# Patient Record
Sex: Male | Born: 1980 | Race: White | Hispanic: No | Marital: Married | Smoking: Never smoker
Health system: Southern US, Community
[De-identification: ages and names within clinical notes are randomized; demographics above are authoritative.]

## PROBLEM LIST (undated history)

## (undated) DIAGNOSIS — Z22322 Carrier or suspected carrier of Methicillin resistant Staphylococcus aureus: Secondary | ICD-10-CM

## (undated) DIAGNOSIS — S7292XA Unspecified fracture of left femur, initial encounter for closed fracture: Secondary | ICD-10-CM

## (undated) HISTORY — DX: Unspecified fracture of left femur, initial encounter for closed fracture: S72.92XA

## (undated) HISTORY — PX: TONSILLECTOMY AND ADENOIDECTOMY: SUR1326

## (undated) HISTORY — PX: APPENDECTOMY: SHX54

---

## 2012-11-20 DIAGNOSIS — Z22322 Carrier or suspected carrier of Methicillin resistant Staphylococcus aureus: Secondary | ICD-10-CM

## 2012-11-20 HISTORY — DX: Carrier or suspected carrier of methicillin resistant Staphylococcus aureus: Z22.322

## 2013-07-25 ENCOUNTER — Encounter (HOSPITAL_COMMUNITY): Payer: Self-pay | Admitting: Emergency Medicine

## 2013-07-25 ENCOUNTER — Emergency Department (HOSPITAL_COMMUNITY)
Admission: EM | Admit: 2013-07-25 | Discharge: 2013-07-25 | Disposition: A | Discharge status: Home / Self Care | Payer: No Typology Code available for payment source | Attending: Emergency Medicine | Admitting: Emergency Medicine

## 2013-07-25 ENCOUNTER — Emergency Department (HOSPITAL_COMMUNITY): Payer: No Typology Code available for payment source

## 2013-07-25 DIAGNOSIS — L02419 Cutaneous abscess of limb, unspecified: Secondary | ICD-10-CM | POA: Insufficient documentation

## 2013-07-25 DIAGNOSIS — L03115 Cellulitis of right lower limb: Secondary | ICD-10-CM

## 2013-07-25 LAB — CBC WITH DIFFERENTIAL/PLATELET
Basophils Absolute: 0 10*3/uL (ref 0.0–0.1)
Basophils Relative: 0 % (ref 0–1)
Eosinophils Absolute: 0.2 10*3/uL (ref 0.0–0.7)
Eosinophils Relative: 2 % (ref 0–5)
HCT: 40.3 % (ref 39.0–52.0)
MCH: 30.8 pg (ref 26.0–34.0)
MCHC: 37.2 g/dL — ABNORMAL HIGH (ref 30.0–36.0)
MCV: 82.8 fL (ref 78.0–100.0)
Monocytes Absolute: 0.8 10*3/uL (ref 0.1–1.0)
Monocytes Relative: 8 % (ref 3–12)
Neutro Abs: 7 10*3/uL (ref 1.7–7.7)
RDW: 11.7 % (ref 11.5–15.5)

## 2013-07-25 LAB — BASIC METABOLIC PANEL
BUN: 23 mg/dL (ref 6–23)
Calcium: 9.3 mg/dL (ref 8.4–10.5)
Creatinine, Ser: 1.36 mg/dL — ABNORMAL HIGH (ref 0.50–1.35)
GFR calc Af Amer: 78 mL/min — ABNORMAL LOW (ref >90)

## 2013-07-25 MED ORDER — CLINDAMYCIN PHOSPHATE 900 MG/50ML IV SOLN: 900.0000 mg | Freq: Once | INTRAVENOUS | Status: DC

## 2013-07-25 MED ORDER — CLINDAMYCIN HCL 150 MG PO CAPS: 450.0000 mg | ORAL_CAPSULE | Freq: Three times a day (TID) | ORAL | Status: DC

## 2013-07-25 NOTE — ED Notes (Signed)
Patient transported to X-ray 

## 2013-07-25 NOTE — ED Provider Notes (Signed)
CSN: [id number]     Arrival date & time 07/25/13  0225 History   First MD Initiated Contact with Patient 07/25/13 [phone]     No chief complaint on file.  (Consider location/radiation/quality/duration/timing/severity/associated sxs/prior Treatment) HPI Comments: 32 year old male presents with a right anterior thigh abscess. Started off as small bumps that he considered to be a folliculitis and has progressed to larger swollen area with erythema over the course of a few days. 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 He tried to drain the abscess at home with a clean scalpel but got minimal fluid and mild blood w/o improvement. Has not had any systemic sx such as fevers or chills, but the redness around his leg has increased and so he felt he needed to be checked out. He also feels like he felt some crepitus on his right inner thigh which concerned him so he presented for an eval in the ED.  The history is provided by the patient.    No past medical history on file. No past surgical history on file. No family history on file. History  Substance Use Topics  . Smoking status: Not on file  . Smokeless tobacco: Not on file  . Alcohol Use: Not on file    Review of Systems  Constitutional: Negative for fever and chills.  Gastrointestinal: Negative for vomiting.  Musculoskeletal: Negative for joint swelling.  Skin: Positive for color change and wound.  Neurological: Negative for weakness.  All other systems reviewed and are negative.    Allergies  Review of patient's allergies indicates no known allergies.  Home Medications   Current Outpatient Rx  Name  Route  Sig  Dispense  Refill  . ibuprofen (ADVIL,MOTRIN) 200 MG tablet   Oral   Take 400 mg by mouth every 6 (six) hours as needed for pain.          BP 131/55  Pulse 57  Temp(Src) 98.9 F (37.2 C) (Oral)  Resp 16  SpO2 97% Physical Exam  Nursing note and vitals reviewed. Constitutional: He is  oriented to person, place, and time. He appears well-developed and well-nourished. No distress.  HENT:  Head: Normocephalic and atraumatic.  Right Ear: External ear normal.  Left Ear: External ear normal.  Nose: Nose normal.  Eyes: Right eye exhibits no discharge. Left eye exhibits no discharge.  Neck: Neck supple.  Cardiovascular: Intact distal pulses.   Pulmonary/Chest: Effort normal.  Abdominal: He exhibits no distension.  Musculoskeletal:       Legs: Neurological: He is alert and oriented to person, place, and time.  Skin: Skin is warm and dry. There is erythema.    ED Course  Procedures (including critical care time) Labs Review Labs Reviewed  CBC WITH DIFFERENTIAL - Abnormal; Notable for the following:    MCHC 37.2 (*)    All other components within normal limits  BASIC METABOLIC PANEL - Abnormal; Notable for the following:    Potassium 3.2 (*)    Glucose, Bld 109 (*)    Creatinine, Ser 1.36 (*)    GFR calc non Af Amer 68 (*)    GFR calc Af Amer 78 (*)    All other components within normal limits   Imaging Review Dg Femur Right  07/25/2013   *RADIOLOGY REPORT*  Clinical Data: Right anterior thigh abscess, question crepitus.  RIGHT FEMUR - 2 VIEW  Comparison: None.  Findings: No subcutaneous emphysema identified in the thigh.  There is soft tissue swelling  of the anterior mid and lower thigh, without radiodense foreign body.  Negative for osseous infection or fracture.  IMPRESSION: No subcutaneous emphysema.  Negative for osseous infection.   Original Report Authenticated By: Tiburcio Pea    MDM   1. Cellulitis of right thigh    Patient is healthy w/o any complicating medical hx such as DM or other immunocompromise. I do not feel any tenderness to medial thigh or crepitus. Xray shows no gas. Bedside u/s shows no pockets of fluid and is c/w cellulitis. Given his streaking I offered IV abx but patient declines and states he'd prefer oral abx and to treat symptomatically  at home. 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 Will d/c with clinda, he will get his first dose at the 24 hour pharmacy as soon as he leaves the ED.     Audree Camel, MD 07/25/13 [phone]

## 2013-07-29 ENCOUNTER — Ambulatory Visit (INDEPENDENT_AMBULATORY_CARE_PROVIDER_SITE_OTHER): Payer: No Typology Code available for payment source | Admitting: Internal Medicine

## 2013-07-29 ENCOUNTER — Telehealth: Payer: Self-pay | Admitting: Internal Medicine

## 2013-07-29 ENCOUNTER — Encounter: Payer: Self-pay | Admitting: Internal Medicine

## 2013-07-29 VITALS — BP 110/80 | Temp 98.4°F | Wt 186.0 lb

## 2013-07-29 DIAGNOSIS — L02419 Cutaneous abscess of limb, unspecified: Secondary | ICD-10-CM

## 2013-07-29 DIAGNOSIS — L0291 Cutaneous abscess, unspecified: Secondary | ICD-10-CM

## 2013-07-29 MED ORDER — CLINDAMYCIN HCL 150 MG PO CAPS: 450.0000 mg | ORAL_CAPSULE | Freq: Three times a day (TID) | ORAL | Status: DC

## 2013-07-29 MED ORDER — SULFAMETHOXAZOLE-TMP DS 800-160 MG PO TABS: 1.0000 | ORAL_TABLET | Freq: Two times a day (BID) | ORAL | Status: AC

## 2013-07-29 NOTE — Telephone Encounter (Signed)
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 Has been self treating his cellulitis w 2 rounds of antibiotics w/ no results. pt going out of town tomorrow to [place]. Would like acute visit today and then schedule and new pt appt .pls advise if you will accept appt this way.

## 2013-07-29 NOTE — Progress Notes (Signed)
Subjective:    Patient ID: Tyler Harrison, male    DOB: [birth year], 32 y.o.   MRN: [id number]  HPI  32 year old white male to establish. Patient complains of dealing with a right thigh abscess since September 1. He first noticed small bump middle of right thigh. His symptoms progressively got worse. Small incision and drainage was performed around September 3rd.  He kept area clan.   Area of redness spread between 9/4 - 9/5. He was started on clindamycin. There was question of whether he felt mild crepitus underneath the skin of his inner thigh. Crepitus promptly resolved. He denies any fever or chills. He finished a three-day course of clindamycin. He tolerated well without any side effects. After consulting with another physician, his antibiotic regimen was changed to Bactrim DS twice a day and Keflex 500 mg 4 times a day. He is on day 3. He reports cellulitis is getting better. He still has indurated area around the abscess. Center is abscess is dark in color.  No culture performed.   Review of Systems  Constitutional: Negative for activity change, appetite change and unexpected weight change.  Eyes: Negative for visual disturbance.  Respiratory: Negative for cough, chest tightness and shortness of breath.   Cardiovascular: Negative for chest pain.  Genitourinary: Negative for difficulty urinating.  Neurological: Negative for headaches.  Gastrointestinal: Negative for abdominal pain, heartburn melena or hematochezia. Negative for diarrhea     Past Medical History  Diagnosis Date  . Femur fracture, left     History of Hairline Fracture    History   Social History  . Marital Status: Married    Spouse Name: N/A    Number of Children: N/A  . Years of Education: N/A   Occupational History  . ER Physician [employer]   Social History Main Topics  . Smoking status: Never Smoker   . Smokeless tobacco: Not on file  . Alcohol Use: Yes  . Drug Use: No  . Sexual Activity: Not on file    Other Topics Concern  . Not on file   Social History Narrative   Works as emergency room physician   Moved from [place]   Married with 3 children (ages 4-1/2, 3, 1 1/2)    Past Surgical History  Procedure Laterality Date  . Appendectomy    . Tonsillectomy and adenoidectomy      Family History  Problem Relation Age of Onset  . Alcohol abuse Father   . Alcohol abuse Brother   . Heart attack Paternal Grandfather     No Known Allergies  Current Outpatient Prescriptions on File Prior to Visit  Medication Sig Dispense Refill  . ibuprofen (ADVIL,MOTRIN) 200 MG tablet Take 400 mg by mouth every 6 (six) hours as needed for pain.       No current facility-administered medications on file prior to visit.    BP 110/80  Temp(Src) 98.4 F (36.9 C) (Oral)  Wt 186 lb (84.369 kg)    Objective:   Physical Exam  Constitutional: He is oriented to person, place, and time. He appears well-developed and well-nourished. No distress.  HENT:  Head: Normocephalic and atraumatic.  Right Ear: External ear normal.  Left Ear: External ear normal.  Mouth/Throat: Oropharynx is clear and moist.  Eyes: EOM are normal. Pupils are equal, round, and reactive to light.  Neck: Neck supple.  No carotid bruit  Cardiovascular: Normal rate, regular rhythm and normal heart sounds.   No murmur heard. Pulmonary/Chest: Effort normal and breath sounds  normal. He has no wheezes.  Abdominal: Bowel sounds are normal. He exhibits no distension and no mass.  Musculoskeletal: He exhibits no edema.  Lymphadenopathy:    He has no cervical adenopathy.  Neurological: He is alert and oriented to person, place, and time. No cranial nerve deficit.  Skin:  To use a 3 cm abscess with surrounding induration. Able to express small amount of pus.   Area is tender with deep palpation.  Mild surrounding erythema that approaches groin area. Surrounding skin is nontender.  Slight odor to wound  Psychiatric: He has a normal  mood and affect. His behavior is normal.          Assessment & Plan:

## 2013-07-29 NOTE — Telephone Encounter (Signed)
Ok per Dr Artist Pais to see as acute and establish later

## 2013-07-29 NOTE — Patient Instructions (Addendum)
Apply warm compress twice daily I suspect you will need incision and drainage once abscess organizes Our office will contact you re: culture results Please contact our office if your symptoms do not improve or gets worse.

## 2013-07-29 NOTE — Telephone Encounter (Signed)
Pt aware and will make appt after visit. Due to schedule

## 2013-07-29 NOTE — Assessment & Plan Note (Addendum)
32 year old white male has right mid thigh abscess with surrounding cellulitis. Since starting antibiotics, his edness slightly improved. Abscess draining small amount of fluid. Culture sent. I suggest we discontinue Keflex and use double coverage for possible MRSA. Continue Bactrim DS and add clindamycin. I suspect abscess will organize and will need to be drained within 3-5 days. Patient to apply warm compress and keep area covered. Patient understands to seek urgent medical care should he develop fever or worsening cellulitis. We discussed surgical referral should he develop complex abscess.  Addendum: 07/30/13 10:18 AM - patient reports draining small pocket of pus.  He is concerned about possible resistance to clindamycin and would like to switch to doxycycline.  Continue same dose of bactrim.  See med list.

## 2013-07-30 MED ORDER — DOXYCYCLINE HYCLATE 100 MG PO TABS: 100.0000 mg | ORAL_TABLET | Freq: Two times a day (BID) | ORAL | Status: DC

## 2013-07-30 NOTE — Telephone Encounter (Addendum)
Pt would like to try to doxycycline instead on clindamycin. Pt forgot he tried clindamycin

## 2013-07-30 NOTE — Addendum Note (Signed)
Addended by: Meda Coffee on: 07/30/2013 10:21 AM   Modules accepted: Orders, Medications

## 2013-07-30 NOTE — Telephone Encounter (Signed)
Per Dr Artist Pais he sent the doxycycline in electronically to pharmacy and he wanted pt to be aware that it causes photosensitivity.  Pt aware and will pick up rx.

## 2013-07-30 NOTE — Telephone Encounter (Signed)
Pt pharm is [facility] on [address] [phone]

## 2013-08-01 LAB — WOUND CULTURE

## 2013-08-30 ENCOUNTER — Encounter (HOSPITAL_COMMUNITY): Payer: Self-pay | Admitting: Emergency Medicine

## 2013-08-30 ENCOUNTER — Emergency Department (HOSPITAL_COMMUNITY)
Admission: EM | Admit: 2013-08-30 | Discharge: 2013-08-30 | Disposition: A | Discharge status: Home / Self Care | Payer: No Typology Code available for payment source | Attending: Emergency Medicine | Admitting: Emergency Medicine

## 2013-08-30 ENCOUNTER — Encounter (HOSPITAL_COMMUNITY)
Admission: EM | Disposition: A | Discharge status: Home / Self Care | Payer: Self-pay | Source: Home / Self Care | Attending: Emergency Medicine

## 2013-08-30 ENCOUNTER — Emergency Department (HOSPITAL_COMMUNITY): Payer: No Typology Code available for payment source | Admitting: Certified Registered Nurse Anesthetist

## 2013-08-30 ENCOUNTER — Encounter (HOSPITAL_COMMUNITY): Payer: No Typology Code available for payment source | Admitting: Certified Registered Nurse Anesthetist

## 2013-08-30 DIAGNOSIS — L02419 Cutaneous abscess of limb, unspecified: Secondary | ICD-10-CM | POA: Insufficient documentation

## 2013-08-30 DIAGNOSIS — Z8781 Personal history of (healed) traumatic fracture: Secondary | ICD-10-CM | POA: Insufficient documentation

## 2013-08-30 DIAGNOSIS — Z139 Encounter for screening, unspecified: Secondary | ICD-10-CM

## 2013-08-30 DIAGNOSIS — Z9889 Other specified postprocedural states: Secondary | ICD-10-CM | POA: Insufficient documentation

## 2013-08-30 DIAGNOSIS — Z8614 Personal history of Methicillin resistant Staphylococcus aureus infection: Secondary | ICD-10-CM | POA: Insufficient documentation

## 2013-08-30 HISTORY — DX: Carrier or suspected carrier of methicillin resistant Staphylococcus aureus: Z22.322

## 2013-08-30 HISTORY — PX: INCISION AND DRAINAGE: SHX5863

## 2013-08-30 LAB — CBC
Hemoglobin: 15.3 g/dL (ref 13.0–17.0)
MCH: 30.2 pg (ref 26.0–34.0)
MCV: 84.2 fL (ref 78.0–100.0)
RBC: 5.06 MIL/uL (ref 4.22–5.81)
WBC: 8.9 10*3/uL (ref 4.0–10.5)

## 2013-08-30 SURGERY — INCISION AND DRAINAGE
Anesthesia: General | Site: Knee | Laterality: Right | Wound class: Dirty or Infected

## 2013-08-30 MED ORDER — PROMETHAZINE HCL 25 MG/ML IJ SOLN: 6.2500 mg | INTRAMUSCULAR | Status: DC | PRN

## 2013-08-30 MED ORDER — VANCOMYCIN HCL IN DEXTROSE 1-5 GM/200ML-% IV SOLN
1000.0000 mg | Freq: Once | INTRAVENOUS | Status: AC
  Administered 2013-08-30: 1000 mg via INTRAVENOUS
  Filled 2013-08-30: qty 200

## 2013-08-30 MED ORDER — ONDANSETRON HCL 4 MG/2ML IJ SOLN
INTRAMUSCULAR | Status: DC | PRN
  Administered 2013-08-30: 4 mg via INTRAMUSCULAR

## 2013-08-30 MED ORDER — SODIUM CHLORIDE 0.9 % IV SOLN: Freq: Once | INTRAVENOUS | Status: DC

## 2013-08-30 MED ORDER — FENTANYL CITRATE 0.05 MG/ML IJ SOLN: 25.0000 ug | INTRAMUSCULAR | Status: DC | PRN

## 2013-08-30 MED ORDER — SODIUM CHLORIDE 0.9 % IR SOLN
Status: DC | PRN
  Administered 2013-08-30: 15:00:00

## 2013-08-30 MED ORDER — TRAMADOL HCL 50 MG PO TABS: 50.0000 mg | ORAL_TABLET | Freq: Four times a day (QID) | ORAL | Status: AC | PRN

## 2013-08-30 MED ORDER — SODIUM CHLORIDE 0.9 % IV BOLUS (SEPSIS)
1000.0000 mL | Freq: Once | INTRAVENOUS | Status: AC
  Administered 2013-08-30: 1000 mL via INTRAVENOUS

## 2013-08-30 MED ORDER — FENTANYL CITRATE 0.05 MG/ML IJ SOLN
INTRAMUSCULAR | Status: DC | PRN
  Administered 2013-08-30 (×2): 50 ug via INTRAVENOUS

## 2013-08-30 MED ORDER — LIDOCAINE HCL (CARDIAC) 20 MG/ML IV SOLN
INTRAVENOUS | Status: DC | PRN
  Administered 2013-08-30: 100 mg via INTRAVENOUS

## 2013-08-30 MED ORDER — LACTATED RINGERS IV SOLN
INTRAVENOUS | Status: DC | PRN
  Administered 2013-08-30: 14:00:00 via INTRAVENOUS

## 2013-08-30 MED ORDER — PROPOFOL 10 MG/ML IV BOLUS
INTRAVENOUS | Status: DC | PRN
  Administered 2013-08-30: 100 mg via INTRAVENOUS
  Administered 2013-08-30: 200 mg via INTRAVENOUS

## 2013-08-30 MED ORDER — 0.9 % SODIUM CHLORIDE (POUR BTL) OPTIME
TOPICAL | Status: DC | PRN
  Administered 2013-08-30: 1000 mL

## 2013-08-30 MED ORDER — HYDROMORPHONE HCL PF 1 MG/ML IJ SOLN: 0.2500 mg | INTRAMUSCULAR | Status: DC | PRN

## 2013-08-30 MED ORDER — MIDAZOLAM HCL 5 MG/5ML IJ SOLN
INTRAMUSCULAR | Status: DC | PRN
  Administered 2013-08-30: 2 mg via INTRAVENOUS

## 2013-08-30 SURGICAL SUPPLY — 12 items
BANDAGE ELASTIC 4 VELCRO ST LF (GAUZE/BANDAGES/DRESSINGS) ×2 IMPLANT
DRSG AQUACEL AG ADV 3.5X 6 (GAUZE/BANDAGES/DRESSINGS) ×2 IMPLANT
DRSG TEGADERM 4X4.75 (GAUZE/BANDAGES/DRESSINGS) ×2 IMPLANT
GAUZE XEROFORM 5X9 LF (GAUZE/BANDAGES/DRESSINGS) ×2 IMPLANT
KIT BASIN OR (CUSTOM PROCEDURE TRAY) ×2 IMPLANT
PACK LOWER EXTREMITY WL (CUSTOM PROCEDURE TRAY) ×2 IMPLANT
SPONGE GAUZE 4X4 12PLY (GAUZE/BANDAGES/DRESSINGS) ×2 IMPLANT
STOCKINETTE 8 INCH (MISCELLANEOUS) ×2 IMPLANT
SUCTION FRAZIER TIP 10 FR DISP (SUCTIONS) ×2 IMPLANT
SUT ETHILON 2 0 PS N (SUTURE) ×2 IMPLANT
TOWEL OR 17X26 10 PK STRL BLUE (TOWEL DISPOSABLE) ×2 IMPLANT
TOWEL OR NON WOVEN STRL DISP B (DISPOSABLE) ×2 IMPLANT

## 2013-08-30 NOTE — Anesthesia Preprocedure Evaluation (Signed)
Anesthesia Evaluation  Patient identified by MRN, date of birth, ID band Patient awake    Reviewed: Allergy & Precautions, H&P , NPO status , Patient's Chart, lab work & pertinent test results  Airway Mallampati: II TM Distance: >3 FB Neck ROM: Full    Dental no notable dental hx.    Pulmonary neg pulmonary ROS,  breath sounds clear to auscultation  Pulmonary exam normal       Cardiovascular Exercise Tolerance: Good negative cardio ROS  Rhythm:Regular Rate:Normal     Neuro/Psych negative neurological ROS  negative psych ROS   GI/Hepatic negative GI ROS, Neg liver ROS,   Endo/Other  negative endocrine ROS  Renal/GU negative Renal ROS  negative genitourinary   Musculoskeletal negative musculoskeletal ROS (+)   Abdominal   Peds negative pediatric ROS (+)  Hematology negative hematology ROS (+)   Anesthesia Other Findings   Reproductive/Obstetrics negative OB ROS                           Anesthesia Physical Anesthesia Plan  ASA: I and emergent  Anesthesia Plan: General   Post-op Pain Management:    Induction: Intravenous  Airway Management Planned: LMA  Additional Equipment:   Intra-op Plan:   Post-operative Plan: Extubation in OR  Informed Consent: I have reviewed the patients History and Physical, chart, labs and discussed the procedure including the risks, benefits and alternatives for the proposed anesthesia with the patient or authorized representative who has indicated his/her understanding and acceptance.   Dental advisory given  Plan Discussed with: CRNA  Anesthesia Plan Comments:         Anesthesia Quick Evaluation

## 2013-08-30 NOTE — Anesthesia Postprocedure Evaluation (Signed)
  Anesthesia Post-op Note  Patient: Tyler Harrison  Procedure(s) Performed: Procedure(s) (LRB): INCISION AND DRAINAGE right knee (Right)  Patient Location: PACU  Anesthesia Type: General  Level of Consciousness: awake and alert   Airway and Oxygen Therapy: Patient Spontanous Breathing  Post-op Pain: mild  Post-op Assessment: Post-op Vital signs reviewed, Patient's Cardiovascular Status Stable, Respiratory Function Stable, Patent Airway and No signs of Nausea or vomiting  Last Vitals:  Filed Vitals:   08/30/13 1500  BP: 124/64  Pulse: 50  Temp:   Resp: 14    Post-op Vital Signs: stable   Complications: No apparent anesthesia complications

## 2013-08-30 NOTE — ED Notes (Signed)
CHG done for OR

## 2013-08-30 NOTE — H&P (Signed)
  Tyler Harrison is an 32 y.o. male.    Chief Complaint:  RIGHT knee superficial abscess    HPI: Pt is a 32 y.o. male with 2-3 weeks history of superficial abscess cultured to be MRSA.  The first was on his anterior thigh, cleared with local treatment, then this second one appeared anterior lateral to his knee.  He was seen and evaluated in the office yesterday but given the persistence despite treatment and concerns for penetration in the joint we felt it was safest to address this formally in the OR.   PCP:  Thomos Lemons, DO  D/C Plans: To be determined following appropriate treatment plan  PMH: Past Medical History  Diagnosis Date  . Femur fracture, left     History of Hairline Fracture  . MRSA (methicillin resistant staph aureus) culture positive 2014    PSH: Past Surgical History  Procedure Laterality Date  . Appendectomy    . Tonsillectomy and adenoidectomy      Social History:  reports that he has never smoked. He does not have any smokeless tobacco history on file. He reports that he drinks alcohol. He reports that he does not use illicit drugs.  Allergies:  No Known Allergies  Medications:  (Not in a hospital admission)  No results found for this or any previous visit (from the past 48 hour(s)). No results found.  ROS: Review of Systems - General ROS: negative for - chills, fever or night sweats Allergy and Immunology ROS: negative for - hives, latex or seasonal allergies Respiratory ROS: no cough, shortness of breath, or wheezing Cardiovascular ROS: no chest pain or dyspnea on exertion Gastrointestinal ROS: no abdominal pain, change in bowel habits, or black or bloody stools Musculoskeletal ROS: negative for - gait disturbance, joint pain or but positive for tightness with knee flexion and pitting edema right leg Dermatological ROS: positive for skin lesion changes  Physican Exam: Blood pressure 137/77, pulse 53, temperature 98.7 F (37.1 C), temperature source  Oral, resp. rate 18, SpO2 99.00%.  Exam: Awake alert healthy male  Right knee small effusion 4-5 cm diameter erythematous area antero-lateral right knee No pain with flexion only tightness at full flexion  NVI Healed lesion anterior thigh  Chest clear Heart normal Abdomen non-tender  Assessment/Plan Assessment:  Right knee superficial abscess felt to be MRSA positive   Plan: Patient to go to OR today for an excision debridement of his right knee abscess. Patient understand risks, benefits and expectation and wishes to proceed.  Will discharge to home with continued PO antibiotics    Madlyn Frankel. Charlann Boxer, MD  08/30/2013, 1:53 PM

## 2013-08-30 NOTE — Transfer of Care (Signed)
Immediate Anesthesia Transfer of Care Note  Patient: Tyler Harrison  Procedure(s) Performed: Procedure(s): INCISION AND DRAINAGE right knee (Right)  Patient Location: PACU  Anesthesia Type:General  Level of Consciousness: awake, alert , oriented and patient cooperative  Airway & Oxygen Therapy: Patient Spontanous Breathing and Patient connected to face mask oxygen  Post-op Assessment: Report given to PACU RN and Post -op Vital signs reviewed and stable  Post vital signs: Reviewed and stable  Complications: No apparent anesthesia complications

## 2013-08-30 NOTE — Brief Op Note (Signed)
08/30/2013  2:54 PM  PATIENT:  Tyler Harrison  32 y.o. male  PRE-OPERATIVE DIAGNOSIS:  Right knee superficial abscess  POST-OPERATIVE DIAGNOSIS:  Right knee superficial abscess  PROCEDURE:  Procedure(s): Excisional and non-excisional debridement right knee abscess  SURGEON:  Surgeon(s) and Role:    * Shelda Pal, MD - Primary  PHYSICIAN ASSISTANT: None  ASSISTANTS: Surgical center   ANESTHESIA:   general  EBL:  Total I/O In: 200 [I.V.:200] Out: -   BLOOD ADMINISTERED:none  DRAINS: none   LOCAL MEDICATIONS USED:  NONE  SPECIMEN:  No Specimen  DISPOSITION OF SPECIMEN:  N/A  COUNTS:  YES  TOURNIQUET:  * No tourniquets in log *  DICTATION: .Other Dictation: Dictation Number [id number]  PLAN OF CARE: Discharge to home after PACU  PATIENT DISPOSITION:  PACU - hemodynamically stable.   Delay start of Pharmacological VTE agent (>24hrs) due to surgical blood loss or risk of bleeding: not applicable

## 2013-08-30 NOTE — Preoperative (Signed)
Beta Blockers   Reason not to administer Beta Blockers:Not Applicable 

## 2013-08-30 NOTE — ED Provider Notes (Signed)
MSE was initiated and I personally evaluated the patient and placed orders (if any) at  12:52 PM on August 30, 2013.  The patient appears stable so that the remainder of the MSE may be completed by another provider, Dr. Charlann Boxer.  Pt is here at recommendations by his orthopedist for outpt surgery for I&D of right knee infection.  Pt is ambulatory, no respiratory symptoms, no SOB.  Labs, IV ordered by Dr. Charlann Boxer and expectation is pt will go to the OR shortly.    Gavin Pound. Babe Clenney, MD 08/30/13 1253

## 2013-08-30 NOTE — ED Notes (Addendum)
Pt presents to ED with pre-arranged plan to go to OR for an I&D of the right knee by Dr. Charlann Boxer. Pt states that the skin area to the right knee was warm to the touch and has redness over the knee cap. Pt has a history of a MRSA + culture a few weeks prior to an area that is proximal and on the anterior mid-thigh. Pt reports being on bacterium and doxycyline. Pt is A/O x4 and in NAD.

## 2013-08-31 NOTE — Op Note (Signed)
Tyler Harrison, Tyler Harrison               ACCOUNT NO.:  [id number]  MEDICAL RECORD NO.:  [id number]  LOCATION:  [facility]                         FACILITY:  [facility]  PHYSICIAN:  Madlyn Frankel. Charlann Boxer, M.D.  DATE OF BIRTH:  Nov 02, 1981  DATE OF PROCEDURE:  08/30/2013 DATE OF DISCHARGE:  08/30/2013                              OPERATIVE REPORT   PREOPERATIVE DIAGNOSIS:  Right knee superficial abscess.  POSTOPERATIVE DIAGNOSIS:  Right knee superficial abscess.  PROCEDURE:  Excisional and nonexcisional debridement of right knee superficial abscess irrigated with 1 L of bacitracin laden fluid. Sharp excision of about 4-5 cm with a scalpel, debriding skin and subcutaneous tissue.  SURGEON:  Madlyn Frankel. Charlann Boxer, M.D.  ASSISTANT:  Surgical team.  ANESTHESIA:  General LMA.  SPECIMENS:  None taken.  DRAINS:  None.  COMPLICATIONS:  None.  TOURNIQUET:  None.  BLOOD LOSS:  Minimal.  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  He had a history of anterior thigh lesion that was diagnosed with a bacterial swab, has been positive for MRSA.  Given the proximity of the knee, there was concern of potential penetration in the knee joint leading to infection.  He was seen and evaluated in the office, I had aspirated his knee and only obtained minimal amount of fluid, but it was clear synovial fluid.  No signs of infection.  Given these findings, I felt comfortable that there was no evidence of any septic arthritis that correlated with his exam, however, given the proximity of his knee joint, the concern for MRSA, as well as due to his employment, we felt that the best thing to do would be to go ahead and definitively treat this in the formal environment while continuing antibiotics.  The risks, benefits, and recurrence versus potential penetration of the joint were identified.   Consent was obtained for benefit of management of this infection.  PROCEDURE IN DETAIL:  The patient was brought to the operative theater. Once adequate anesthesia was administered, the patient was positioned supine.  I had given him 1 g of vancomycin in the holding room.  His right leg was then prepped and draped in sterile fashion.  Time-out was performed identifying the patient, planned procedure, and extremity.  At this point, I made a small 3-4 cm incision over the area of his superficial abscess draining obvious purulence.  I sharply debrided and excised using a scalpel the skin and subcutaneous tissue that was affected by this area.  I then debrided the underlying soft tissues. Following the debridement and obtaining hemostasis, I irrigated this wound out with 1 L of bacitracin laden fluid.  Following further evaluation, I reapproximated the skin edges using 4-0 interrupted nylon sutures allowing for primary closure of the wound, but yet allowing for some drainage if some were to happen.  His knee was then dressed with Xeroform, gauze, Tegaderm, and an Ace wrap.  The patient was brought to the recovery room in stable condition tolerating the procedure well.  He will return home this evening. Continue antibiotics.  I will have him come back  to see me in about 10- 12 days for wound check.     Madlyn Frankel Charlann Boxer, M.D.     MDO/MEDQ  D:  08/30/2013  T:  08/31/2013  Job:  [id number]

## 2013-09-01 ENCOUNTER — Encounter (HOSPITAL_COMMUNITY): Payer: Self-pay | Admitting: Orthopedic Surgery

## 2014-08-14 ENCOUNTER — Ambulatory Visit (HOSPITAL_COMMUNITY)
Admission: RE | Admit: 2014-08-14 | Discharge: 2014-08-14 | Disposition: A | Discharge status: Home / Self Care | Payer: No Typology Code available for payment source | Attending: Emergency Medicine | Admitting: Emergency Medicine

## 2014-08-14 ENCOUNTER — Other Ambulatory Visit (HOSPITAL_COMMUNITY): Payer: Self-pay | Admitting: Emergency Medicine

## 2014-08-14 ENCOUNTER — Ambulatory Visit (HOSPITAL_COMMUNITY)
Admission: RE | Admit: 2014-08-14 | Discharge: 2014-08-14 | Disposition: A | Discharge status: Home / Self Care | Payer: PRIVATE HEALTH INSURANCE | Source: Ambulatory Visit | Attending: Emergency Medicine | Admitting: Emergency Medicine

## 2014-08-14 DIAGNOSIS — R52 Pain, unspecified: Secondary | ICD-10-CM

## 2014-08-14 DIAGNOSIS — M79609 Pain in unspecified limb: Secondary | ICD-10-CM | POA: Diagnosis not present

## 2014-09-03 IMAGING — CR DG FEMUR 2+V*R*
4 series · 4 of 4 positions shown · non-contrast
Comparison: None.

CLINICAL DATA: Right anterior thigh abscess, question crepitus.

RIGHT FEMUR - 2 VIEW

[t femur with hip  ap right]
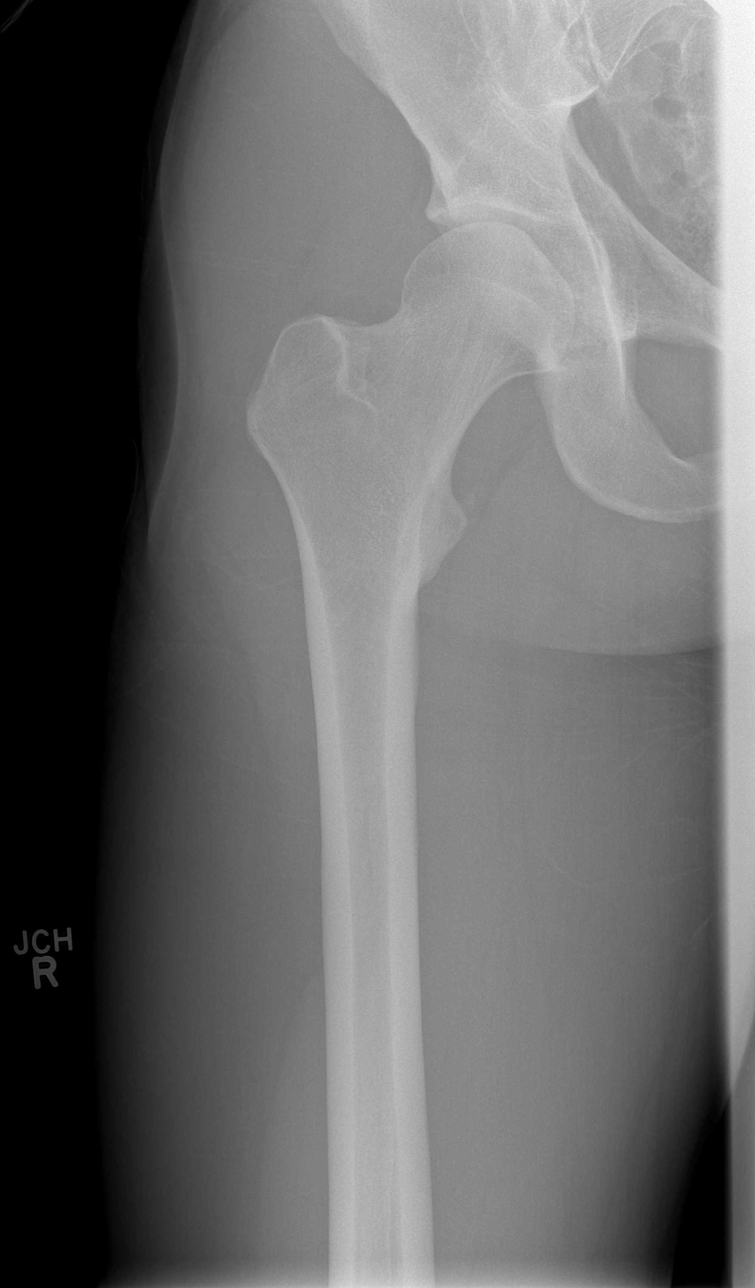

[t femur with knee ap right]
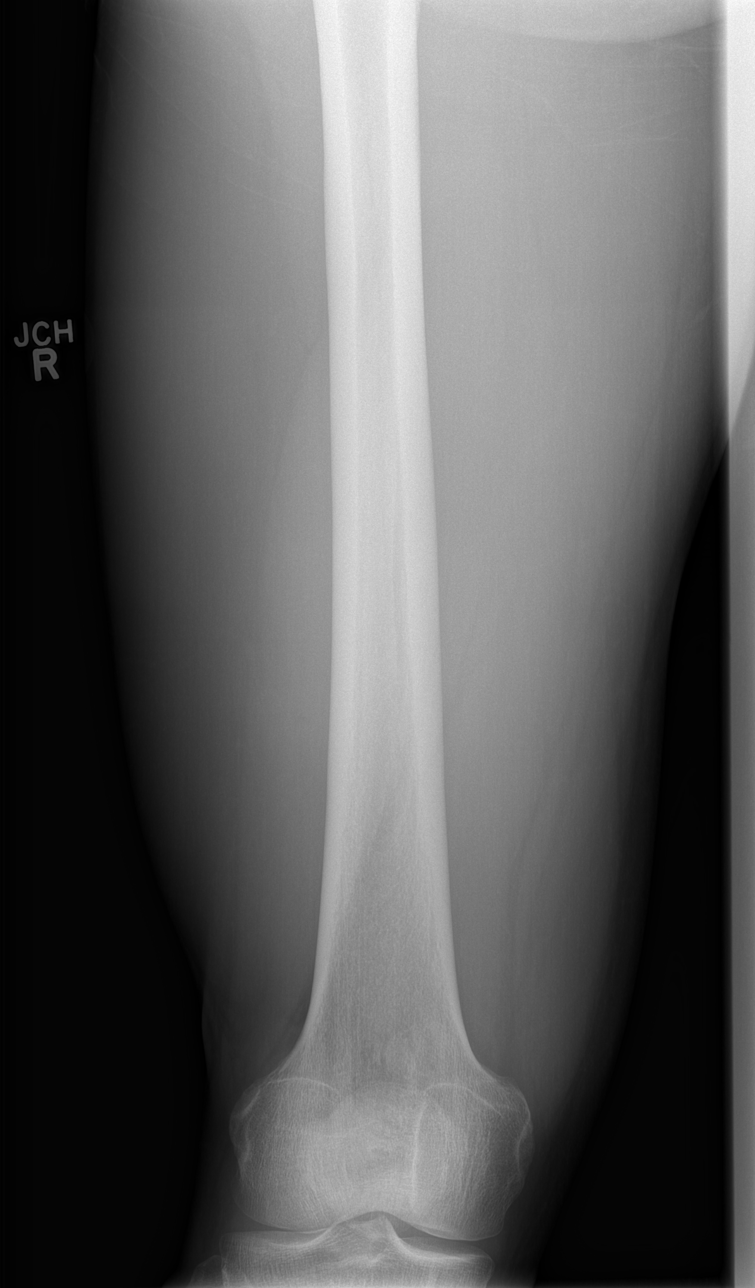

[t femur with hip lat right]
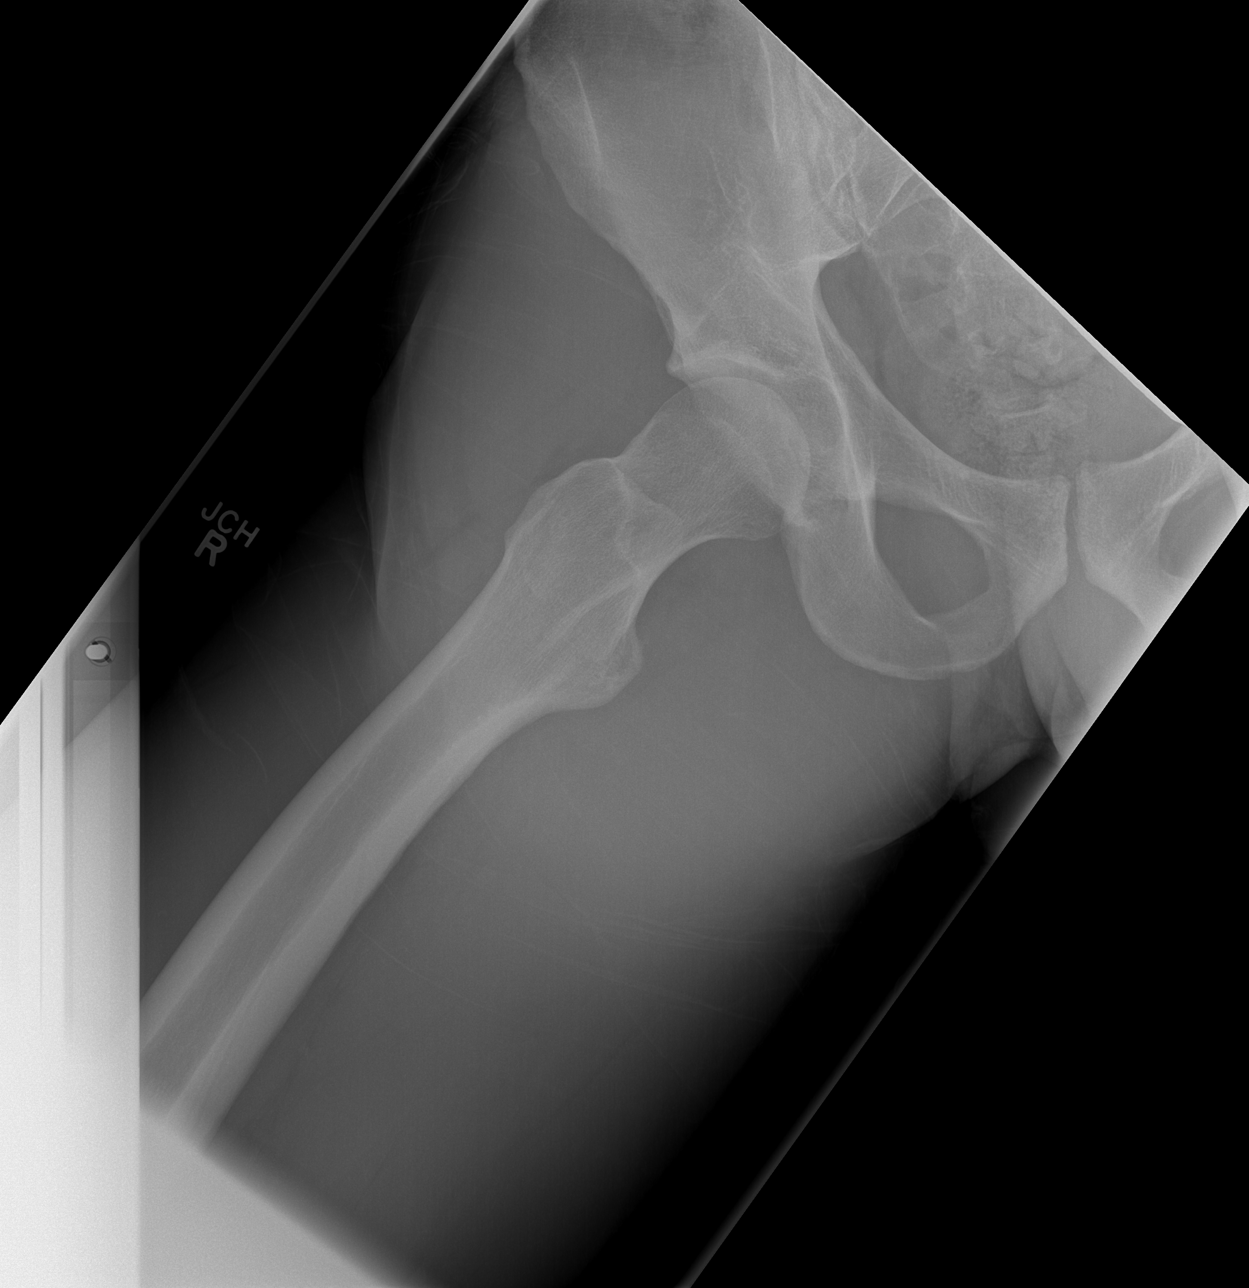

[t femur with knee lat right]
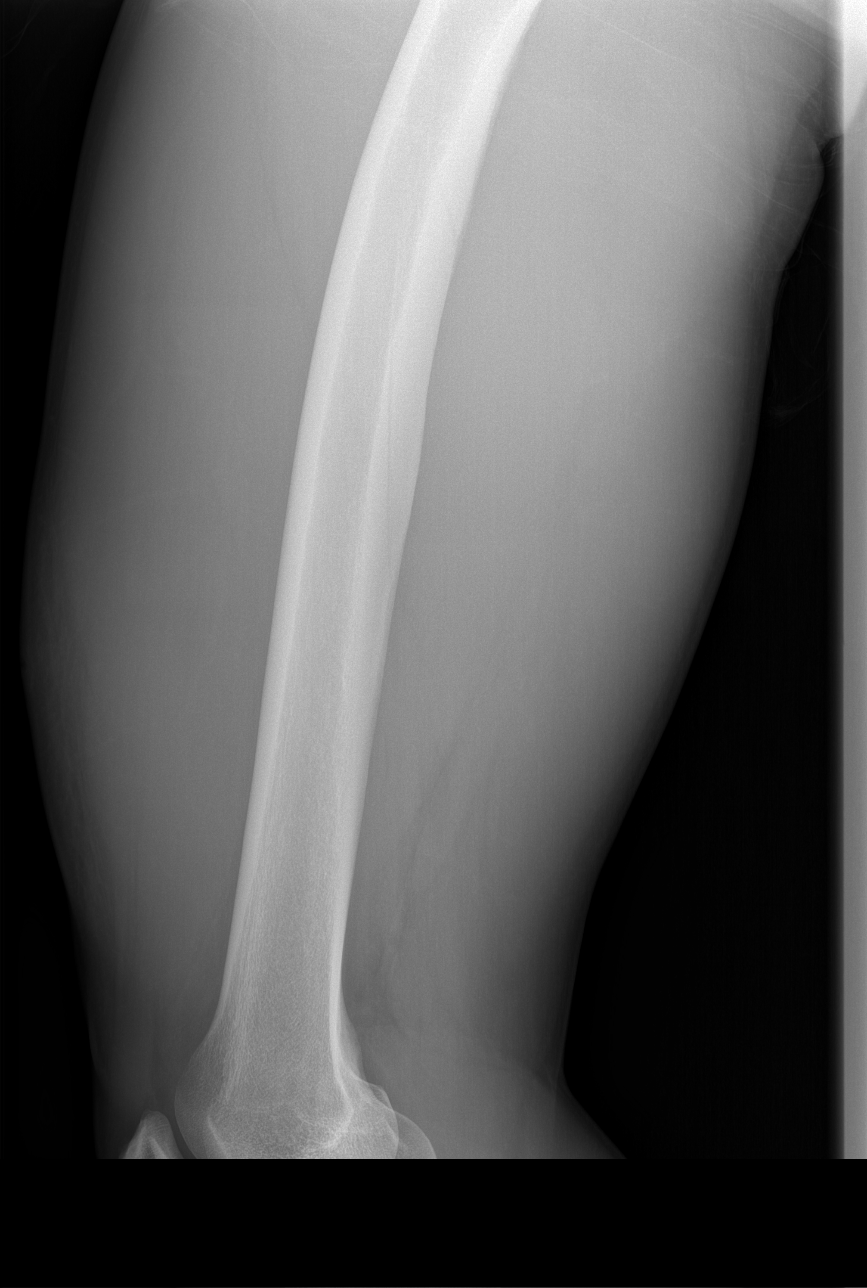

[4 of 4 positions shown; findings below may reference images not displayed]

FINDINGS: No subcutaneous emphysema identified in the thigh.  There
is soft tissue swelling of the anterior mid and lower thigh,
without radiodense foreign body.  Negative for osseous infection or
fracture.
IMPRESSION: No subcutaneous emphysema.  Negative for osseous infection.

## 2015-09-23 IMAGING — CR DG FOOT COMPLETE 3+V*L*
3 series · 3 of 3 positions shown · non-contrast
Comparison: None.

CLINICAL DATA: First metatarsal pain, injury playing [employer]

EXAM:
LEFT FOOT - COMPLETE 3+ VIEW

[x ankle ap left]
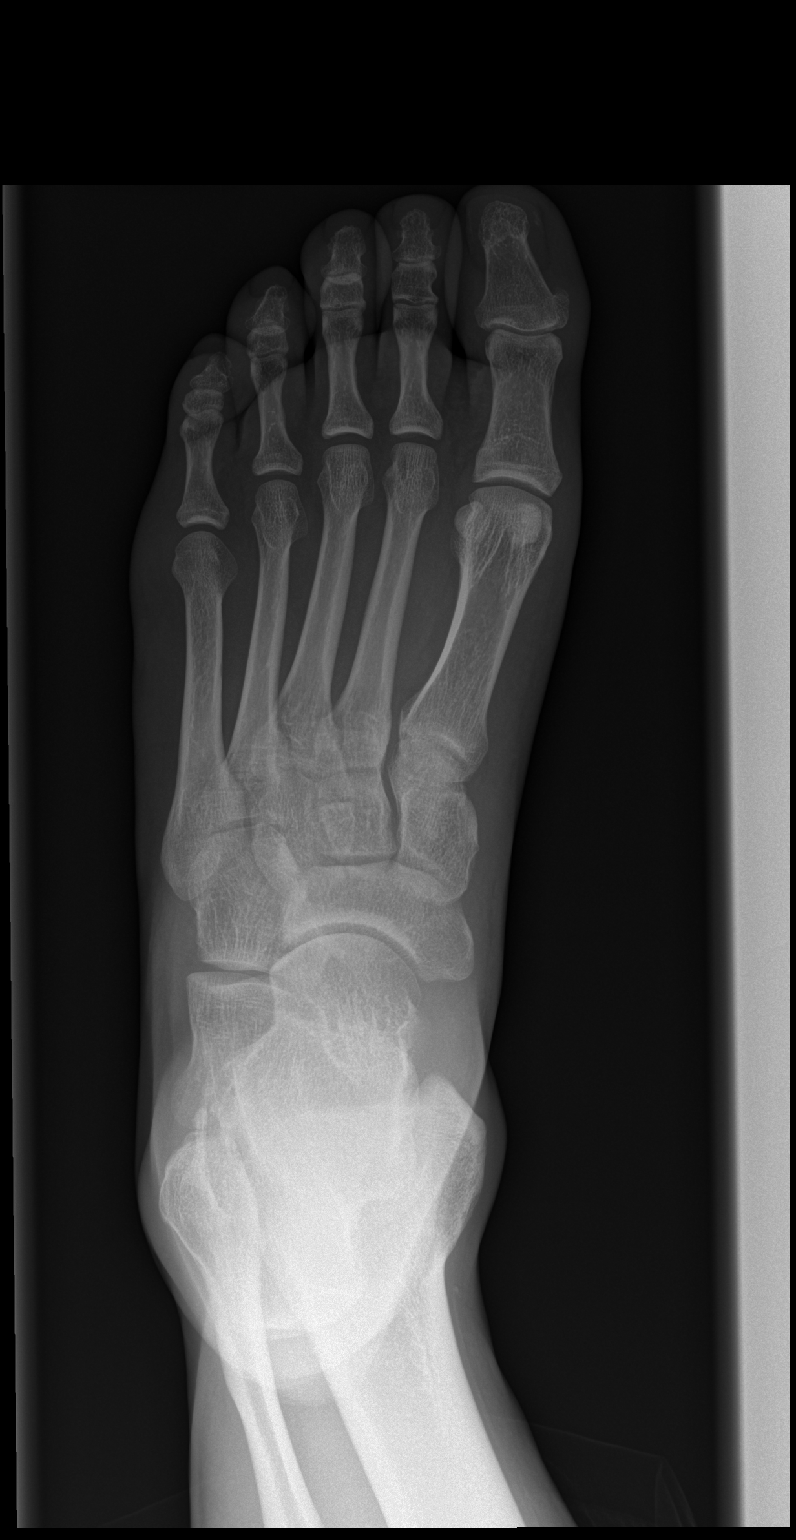

[x ankle obl left]
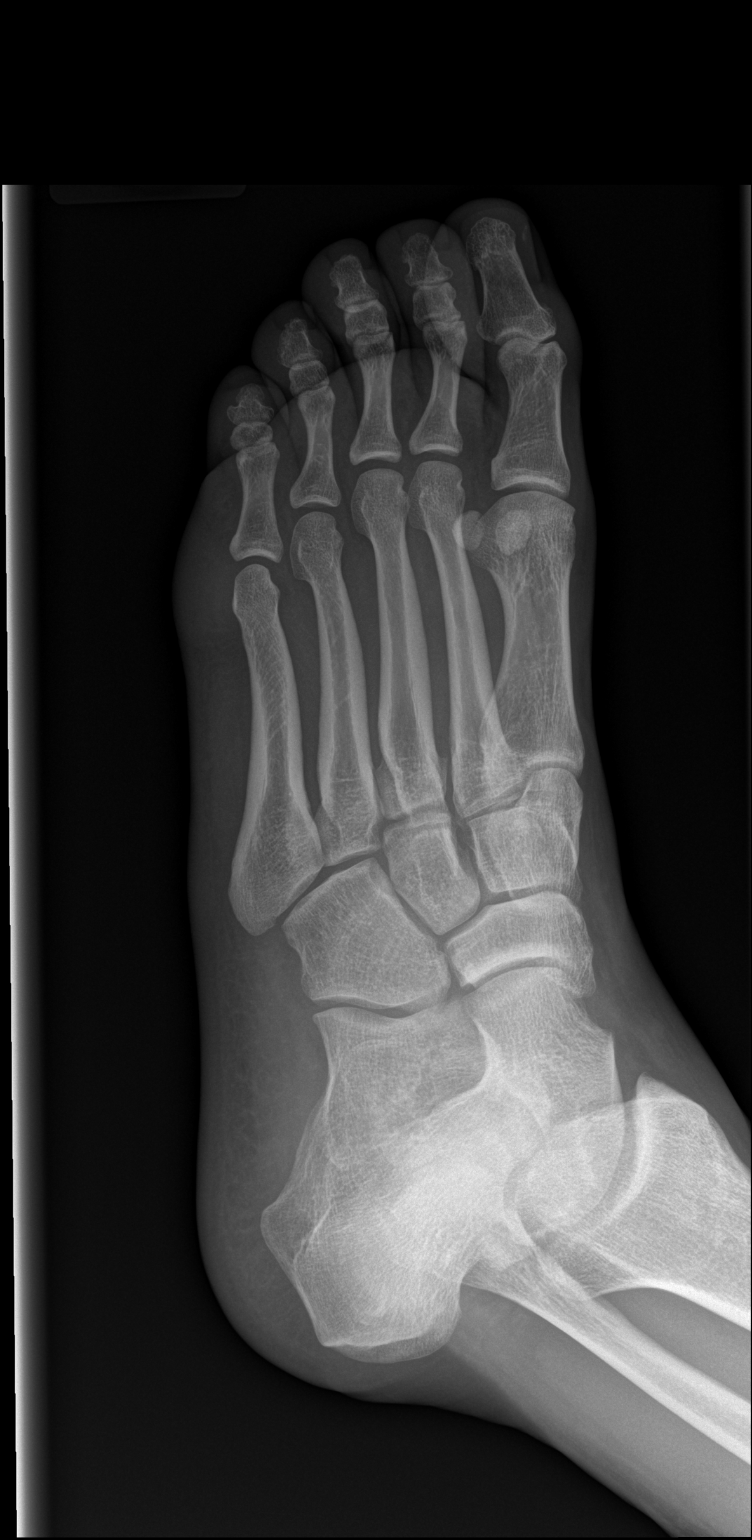

[x ankle lat left]
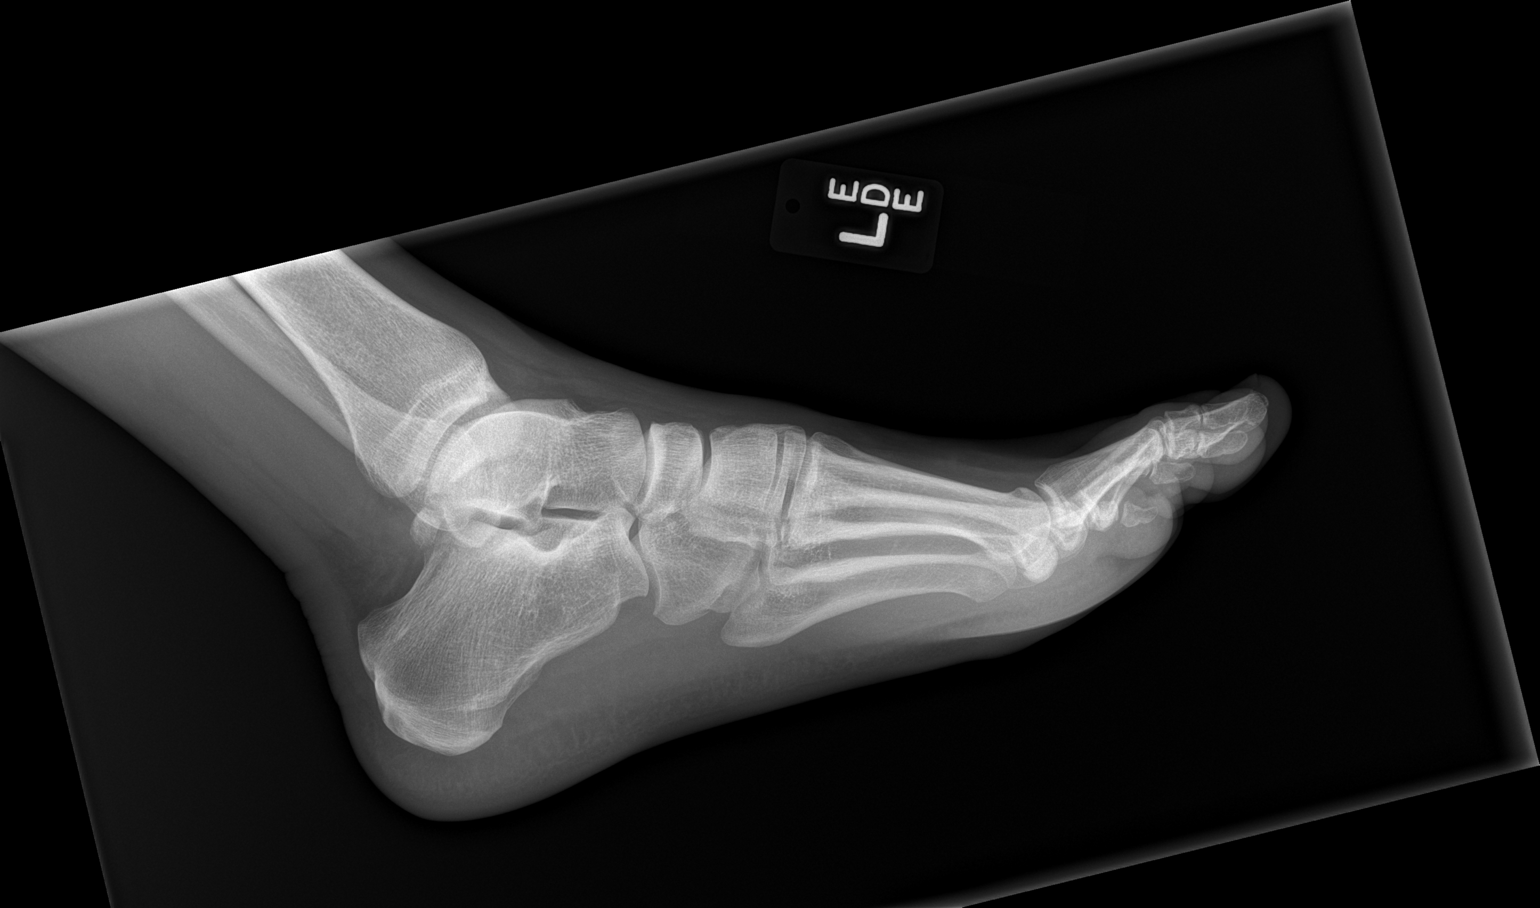

[3 of 3 positions shown; findings below may reference images not displayed]

FINDINGS: Three views of the left foot submitted. No acute fracture or
subluxation. No radiopaque foreign body.
IMPRESSION: Negative.

## 2017-10-31 ENCOUNTER — Ambulatory Visit
Admission: RE | Admit: 2017-10-31 | Discharge: 2017-10-31 | Disposition: A | Discharge status: Home / Self Care | Payer: PRIVATE HEALTH INSURANCE | Source: Ambulatory Visit | Attending: Family Medicine | Admitting: Family Medicine

## 2017-10-31 ENCOUNTER — Other Ambulatory Visit: Payer: PRIVATE HEALTH INSURANCE

## 2017-10-31 ENCOUNTER — Other Ambulatory Visit: Payer: Self-pay | Admitting: Family Medicine

## 2017-10-31 DIAGNOSIS — M545 Low back pain, unspecified: Secondary | ICD-10-CM

## 2017-10-31 DIAGNOSIS — M546 Pain in thoracic spine: Secondary | ICD-10-CM

## 2017-11-06 ENCOUNTER — Other Ambulatory Visit: Payer: Self-pay | Admitting: Family Medicine

## 2017-11-06 DIAGNOSIS — M545 Low back pain: Secondary | ICD-10-CM

## 2020-06-28 ENCOUNTER — Ambulatory Visit (HOSPITAL_COMMUNITY)
Admission: EM | Admit: 2020-06-28 | Discharge: 2020-06-28 | Disposition: A | Discharge status: Home / Self Care | Payer: PRIVATE HEALTH INSURANCE | Attending: Family Medicine | Admitting: Family Medicine

## 2020-06-28 DIAGNOSIS — Z20822 Contact with and (suspected) exposure to covid-19: Secondary | ICD-10-CM | POA: Insufficient documentation

## 2020-06-28 LAB — SARS CORONAVIRUS 2 BY RT PCR (HOSPITAL ORDER, PERFORMED IN ~~LOC~~ HOSPITAL LAB): SARS Coronavirus 2: POSITIVE — AB

## 2020-06-28 NOTE — ED Provider Notes (Signed)
[facility]    CSN: [id number] Arrival date & time: 06/28/20  0539      History   Chief Complaint No chief complaint on file.   HPI Tyler Harrison is a 39 y.o. male.   Pt here for covid testing. Slight cough. 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      Past Medical History:  Diagnosis Date  . Femur fracture, left    History of Hairline Fracture  . MRSA (methicillin resistant staph aureus) culture positive 2014    Patient Active Problem List   Diagnosis Date Noted  . Abscess 07/29/2013    Past Surgical History:  Procedure Laterality Date  . APPENDECTOMY    . INCISION AND DRAINAGE Right 08/30/2013   Procedure: INCISION AND DRAINAGE right knee;  Surgeon: Shelda Pal, MD;  Location: [facility];  Service: Orthopedics;  Laterality: Right;  . TONSILLECTOMY AND ADENOIDECTOMY         Home Medications    Prior to Admission medications   Medication Sig Start Date End Date Taking? Authorizing Provider  ibuprofen (ADVIL,MOTRIN) 200 MG tablet Take 800 mg by mouth 2 (two) times daily as needed for pain.     [provider]  sulfamethoxazole-trimethoprim (BACTRIM DS) 800-160 MG per tablet Take 1 tablet by mouth 2 (two) times daily. 07/29/13   Artist Pais, Doe-Hyun R, DO  traMADol (ULTRAM) 50 MG tablet Take 1-2 tablets (50-100 mg total) by mouth every 6 (six) hours as needed for pain. 08/30/13   Lanney Gins, PA-C    Family History Family History  Problem Relation Age of Onset  . Alcohol abuse Father   . Alcohol abuse Brother   . Heart attack Paternal Grandfather     Social History Social History   Tobacco Use  . Smoking status: Never Smoker  Substance Use Topics  . Alcohol use: Yes  . Drug use: No     Allergies   Patient has no known allergies.   Review of Systems Review of Systems   Physical Exam Triage Vital Signs ED Triage Vitals  Enc Vitals Group     BP      Pulse      Resp      Temp      Temp src      SpO2      Weight      Height       Head Circumference      Peak Flow      Pain Score      Pain Loc      Pain Edu?      Excl. in GC?    No data found.  Updated Vital Signs There were no vitals taken for this visit.  Visual Acuity Right Eye Distance:   Left Eye Distance:   Bilateral Distance:    Right Eye Near:   Left Eye Near:    Bilateral Near:     Physical Exam Vitals and nursing note reviewed.  Constitutional:      Appearance: Normal appearance.  HENT:     Head: Normocephalic and atraumatic.  Eyes:     Conjunctiva/sclera: Conjunctivae normal.  Pulmonary:     Effort: Pulmonary effort is normal.  Musculoskeletal:        General: Normal range of motion.     Cervical back: Normal range of motion.  Skin:    General: Skin is warm and dry.  Neurological:     Mental Status: He is alert.  Psychiatric:  Mood and Affect: Mood normal.      UC Treatments / Results  Labs (all labs ordered are listed, but only abnormal results are displayed) Labs Reviewed - No data to display  EKG   Radiology No results found.  Procedures Procedures (including critical care time)  Medications Ordered in UC Medications - No data to display  Initial Impression / Assessment and Plan / UC Course  I have reviewed the triage vital signs and the nursing notes.  Pertinent labs & imaging results that were available during my care of the patient were reviewed by me and considered in my medical decision making (see chart for details).     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  Final Clinical Impressions(s) / UC Diagnoses   Final diagnoses:  Encounter for laboratory testing for COVID-19 virus     Discharge Instructions     We have tested you for covid Should have the results in a few hours.  I will call you with the results.     ED Prescriptions    None     PDMP not reviewed this encounter.   Dahlia Byes A, NP 06/28/20 [phone]

## 2020-06-28 NOTE — Discharge Instructions (Signed)
We have tested you for covid Should have the results in a few hours.  I will call you with the results.

## 2024-07-23 ENCOUNTER — Other Ambulatory Visit (HOSPITAL_COMMUNITY): Payer: Self-pay

## 2024-07-23 MED ORDER — RIFAMPIN 300 MG PO CAPS
600.0000 mg | ORAL_CAPSULE | Freq: Every day | ORAL | 0 refills | Status: AC
  Filled 2024-07-23: qty 8, 4d supply, fill #0

## 2024-07-23 MED ORDER — CIPROFLOXACIN HCL 500 MG PO TABS
500.0000 mg | ORAL_TABLET | Freq: Once | ORAL | 0 refills | Status: AC
  Filled 2024-07-23: qty 1, 1d supply, fill #0
